# Patient Record
Sex: Female | Born: 1970 | Race: White | Hispanic: No | Marital: Single | Smoking: Current every day smoker
Health system: Southern US, Community
[De-identification: ages and names within clinical notes are randomized; demographics above are authoritative.]

## PROBLEM LIST (undated history)

## (undated) ENCOUNTER — Emergency Department: Admission: EM | Discharge status: Absent without leave | Payer: Medicaid Other

## (undated) DIAGNOSIS — F419 Anxiety disorder, unspecified: Secondary | ICD-10-CM

## (undated) DIAGNOSIS — I10 Essential (primary) hypertension: Secondary | ICD-10-CM

---

## 2004-06-11 ENCOUNTER — Emergency Department: Payer: Self-pay | Admitting: Emergency Medicine

## 2004-09-22 ENCOUNTER — Emergency Department: Payer: Self-pay | Admitting: Unknown Physician Specialty

## 2005-01-22 ENCOUNTER — Emergency Department: Payer: Self-pay | Admitting: Internal Medicine

## 2005-03-15 ENCOUNTER — Emergency Department: Payer: Self-pay | Admitting: Emergency Medicine

## 2005-03-16 ENCOUNTER — Emergency Department: Payer: Self-pay | Admitting: Emergency Medicine

## 2008-02-18 ENCOUNTER — Emergency Department: Payer: Self-pay | Admitting: Emergency Medicine

## 2008-12-16 ENCOUNTER — Emergency Department: Payer: Self-pay | Admitting: Emergency Medicine

## 2009-11-30 ENCOUNTER — Emergency Department: Payer: Self-pay | Admitting: Emergency Medicine

## 2010-12-06 ENCOUNTER — Ambulatory Visit: Payer: Self-pay | Admitting: Internal Medicine

## 2011-01-03 ENCOUNTER — Ambulatory Visit: Payer: Self-pay | Admitting: Internal Medicine

## 2011-08-18 ENCOUNTER — Emergency Department: Payer: Self-pay | Admitting: Emergency Medicine

## 2011-08-20 ENCOUNTER — Emergency Department: Payer: Self-pay | Admitting: *Deleted

## 2011-09-10 ENCOUNTER — Emergency Department: Payer: Self-pay | Admitting: Emergency Medicine

## 2012-12-30 ENCOUNTER — Emergency Department: Payer: Self-pay | Admitting: Emergency Medicine

## 2013-05-02 ENCOUNTER — Emergency Department: Payer: Self-pay | Admitting: Emergency Medicine

## 2013-10-03 ENCOUNTER — Ambulatory Visit: Payer: Self-pay | Admitting: Unknown Physician Specialty

## 2014-03-06 ENCOUNTER — Emergency Department: Payer: Self-pay | Admitting: Emergency Medicine

## 2014-03-06 LAB — CBC WITH DIFFERENTIAL/PLATELET
BASOS ABS: 0.1 10*3/uL (ref 0.0–0.1)
Basophil %: 0.4 %
Eosinophil #: 0 10*3/uL (ref 0.0–0.7)
Eosinophil %: 0.1 %
HCT: 42.9 % (ref 35.0–47.0)
HGB: 14.5 g/dL (ref 12.0–16.0)
LYMPHS PCT: 9.5 %
Lymphocyte #: 2.1 10*3/uL (ref 1.0–3.6)
MCH: 29.9 pg (ref 26.0–34.0)
MCHC: 33.7 g/dL (ref 32.0–36.0)
MCV: 89 fL (ref 80–100)
MONOS PCT: 7 %
Monocyte #: 1.6 x10 3/mm — ABNORMAL HIGH (ref 0.2–0.9)
Neutrophil #: 18.5 10*3/uL — ABNORMAL HIGH (ref 1.4–6.5)
Neutrophil %: 83 %
PLATELETS: 284 10*3/uL (ref 150–440)
RBC: 4.83 10*6/uL (ref 3.80–5.20)
RDW: 13.1 % (ref 11.5–14.5)
WBC: 22.4 10*3/uL — ABNORMAL HIGH (ref 3.6–11.0)

## 2014-03-06 LAB — GC/CHLAMYDIA PROBE AMP

## 2014-03-06 LAB — URINALYSIS, COMPLETE
Bacteria: NONE SEEN
Bilirubin,UR: NEGATIVE
Glucose,UR: NEGATIVE mg/dL (ref 0–75)
KETONE: NEGATIVE
NITRITE: NEGATIVE
Ph: 7 (ref 4.5–8.0)
Protein: NEGATIVE
RBC,UR: NONE SEEN /HPF (ref 0–5)
Specific Gravity: 1.005 (ref 1.003–1.030)
Squamous Epithelial: 6
WBC UR: 25 /HPF (ref 0–5)

## 2014-03-06 LAB — BASIC METABOLIC PANEL
ANION GAP: 7 (ref 7–16)
BUN: 7 mg/dL (ref 7–18)
Calcium, Total: 9.1 mg/dL (ref 8.5–10.1)
Chloride: 98 mmol/L (ref 98–107)
Co2: 28 mmol/L (ref 21–32)
Creatinine: 0.95 mg/dL (ref 0.60–1.30)
EGFR (African American): >60
GLUCOSE: 125 mg/dL — AB (ref 65–99)
Osmolality: 266 (ref 275–301)
Potassium: 4 mmol/L (ref 3.5–5.1)
SODIUM: 133 mmol/L — AB (ref 136–145)

## 2014-03-06 LAB — WET PREP, GENITAL

## 2014-03-09 LAB — CULTURE, BLOOD (SINGLE)

## 2014-03-12 LAB — CULTURE, BLOOD (SINGLE)

## 2014-07-28 ENCOUNTER — Emergency Department: Payer: Self-pay | Admitting: Emergency Medicine

## 2014-07-28 LAB — COMPREHENSIVE METABOLIC PANEL
ALBUMIN: 4.7 g/dL (ref 3.4–5.0)
ALT: 70 U/L — AB
ANION GAP: 11 (ref 7–16)
AST: 87 U/L — AB (ref 15–37)
Alkaline Phosphatase: 94 U/L
BUN: 15 mg/dL (ref 7–18)
Bilirubin,Total: 0.6 mg/dL (ref 0.2–1.0)
CHLORIDE: 105 mmol/L (ref 98–107)
CO2: 21 mmol/L (ref 21–32)
Calcium, Total: 9.2 mg/dL (ref 8.5–10.1)
Creatinine: 1.34 mg/dL — ABNORMAL HIGH (ref 0.60–1.30)
EGFR (African American): 56 — ABNORMAL LOW
GFR CALC NON AF AMER: 46 — AB
Glucose: 88 mg/dL (ref 65–99)
OSMOLALITY: 274 (ref 275–301)
Potassium: 3.3 mmol/L — ABNORMAL LOW (ref 3.5–5.1)
Sodium: 137 mmol/L (ref 136–145)
TOTAL PROTEIN: 8.4 g/dL — AB (ref 6.4–8.2)

## 2014-07-28 LAB — CBC
HCT: 39.8 % (ref 35.0–47.0)
HGB: 13.4 g/dL (ref 12.0–16.0)
MCH: 30.5 pg (ref 26.0–34.0)
MCHC: 33.6 g/dL (ref 32.0–36.0)
MCV: 91 fL (ref 80–100)
PLATELETS: 311 10*3/uL (ref 150–440)
RBC: 4.37 10*6/uL (ref 3.80–5.20)
RDW: 12.8 % (ref 11.5–14.5)
WBC: 12.4 10*3/uL — ABNORMAL HIGH (ref 3.6–11.0)

## 2014-07-28 LAB — ACETAMINOPHEN LEVEL: Acetaminophen: <2 ug/mL

## 2014-07-28 LAB — SALICYLATE LEVEL: Salicylates, Serum: 3 mg/dL — ABNORMAL HIGH

## 2014-07-28 LAB — ETHANOL: Ethanol: <3 mg/dL

## 2015-03-27 ENCOUNTER — Emergency Department
Admission: EM | Admit: 2015-03-27 | Discharge: 2015-03-27 | Disposition: A | Discharge status: Home / Self Care | Payer: Medicaid Other | Attending: Emergency Medicine | Admitting: Emergency Medicine

## 2015-03-27 ENCOUNTER — Encounter: Payer: Self-pay | Admitting: *Deleted

## 2015-03-27 ENCOUNTER — Emergency Department: Payer: Medicaid Other

## 2015-03-27 DIAGNOSIS — Y92410 Unspecified street and highway as the place of occurrence of the external cause: Secondary | ICD-10-CM | POA: Diagnosis not present

## 2015-03-27 DIAGNOSIS — Y998 Other external cause status: Secondary | ICD-10-CM | POA: Diagnosis not present

## 2015-03-27 DIAGNOSIS — Z720 Tobacco use: Secondary | ICD-10-CM | POA: Insufficient documentation

## 2015-03-27 DIAGNOSIS — S93601A Unspecified sprain of right foot, initial encounter: Secondary | ICD-10-CM | POA: Diagnosis not present

## 2015-03-27 DIAGNOSIS — Y9389 Activity, other specified: Secondary | ICD-10-CM | POA: Diagnosis not present

## 2015-03-27 DIAGNOSIS — S99921A Unspecified injury of right foot, initial encounter: Secondary | ICD-10-CM | POA: Diagnosis present

## 2015-03-27 HISTORY — DX: Anxiety disorder, unspecified: F41.9

## 2015-03-27 MED ORDER — IBUPROFEN 800 MG PO TABS
ORAL_TABLET | ORAL | Status: DC
Start: 2015-03-27 — End: 2015-03-27
  Filled 2015-03-27: qty 1

## 2015-03-27 MED ORDER — TRAMADOL HCL 50 MG PO TABS: 100.0000 mg | ORAL_TABLET | Freq: Once | ORAL | Status: DC

## 2015-03-27 MED ORDER — TRAMADOL HCL 50 MG PO TABS
ORAL_TABLET | ORAL | Status: AC
  Filled 2015-03-27: qty 2

## 2015-03-27 MED ORDER — IBUPROFEN 800 MG PO TABS: 800.0000 mg | ORAL_TABLET | Freq: Once | ORAL | Status: DC

## 2015-03-27 NOTE — ED Notes (Signed)
Pt angry and anxious, yelling, when asked about allergies pt stated she had no allergies, when offered tramadol for pain pt refused stating "I get hives", Pa notified, when offered ibprofuen pt refused stating she was at her limit for the day, Pa notified, pt states "I am not a pill seeker", calling this RN a b----, pt then proceeds to ask for food
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
## 2015-03-27 NOTE — Discharge Instructions (Signed)
Foot Sprain The muscles and cord like structures which attach muscle to bone (tendons) that surround the feet are made up of units. A foot sprain can occur at the weakest spot in any of these units. This condition is most often caused by injury to or overuse of the foot, as from playing contact sports, or aggravating a previous injury, or from poor conditioning, or obesity. SYMPTOMS  Pain with movement of the foot.  Tenderness and swelling at the injury site.  Loss of strength is present in moderate or severe sprains. THE THREE GRADES OR SEVERITY OF FOOT SPRAIN ARE:  Mild (Grade I): Slightly pulled muscle without tearing of muscle or tendon fibers or loss of strength.  Moderate (Grade II): Tearing of fibers in a muscle, tendon, or at the attachment to bone, with small decrease in strength.  Severe (Grade III): Rupture of the muscle-tendon-bone attachment, with separation of fibers. Severe sprain requires surgical repair. Often repeating (chronic) sprains are caused by overuse. Sudden (acute) sprains are caused by direct injury or over-use. DIAGNOSIS  Diagnosis of this condition is usually by your own observation. If problems continue, a caregiver may be required for further evaluation and treatment. X-rays may be required to make sure there are not breaks in the bones (fractures) present. Continued problems may require physical therapy for treatment. PREVENTION  Use strength and conditioning exercises appropriate for your sport.  Warm up properly prior to working out.  Use athletic shoes that are made for the sport you are participating in.  Allow adequate time for healing. Early return to activities makes repeat injury more likely, and can lead to an unstable arthritic foot that can result in prolonged disability. Mild sprains generally heal in 3 to 10 days, with moderate and severe sprains taking 2 to 10 weeks. Your caregiver can help you determine the proper time required for  healing. HOME CARE INSTRUCTIONS   Apply ice to the injury for 15-20 minutes, 03-04 times per day. Put the ice in a plastic bag and place a towel between the bag of ice and your skin.  An elastic wrap (like an Ace bandage) may be used to keep swelling down.  Keep foot above the level of the heart, or at least raised on a footstool, when swelling and pain are present.  Try to avoid use other than gentle range of motion while the foot is painful. Do not resume use until instructed by your caregiver. Then begin use gradually, not increasing use to the point of pain. If pain does develop, decrease use and continue the above measures, gradually increasing activities that do not cause discomfort, until you gradually achieve normal use.  Use crutches if and as instructed, and for the length of time instructed.  Keep injured foot and ankle wrapped between treatments.  Massage foot and ankle for comfort and to keep swelling down. Massage from the toes up towards the knee.  Only take over-the-counter or prescription medicines for pain, discomfort, or fever as directed by your caregiver. SEEK IMMEDIATE MEDICAL CARE IF:   Your pain and swelling increase, or pain is not controlled with medications.  You have loss of feeling in your foot or your foot turns cold or blue.  You develop new, unexplained symptoms, or an increase of the symptoms that brought you to your caregiver. MAKE SURE YOU:   Understand these instructions.  Will watch your condition.  Will get help right away if you are not doing well or get worse. Document Released:  01/10/2002 Document Revised: 10/13/2011 Document Reviewed: 03/09/2008 Exit[employer] Patient Information 2015 [place], [place]. This information is not intended to replace advice given to you by your health care provider. Make sure you discuss any questions you have with your health care provider.  Your exam and x-ray show a sprain to the foot. There is NO fracture. Wear the  ace bandage and cast shoe for comfort. Follow-up with Dr. Ellsworth Lennox for further care. Apply ice to reduce swelling. Rest with the foot elevated when seated. Continue with ibuprofen for pain relief.
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
## 2016-05-20 ENCOUNTER — Other Ambulatory Visit: Payer: Self-pay | Admitting: Internal Medicine

## 2016-05-20 DIAGNOSIS — Z1231 Encounter for screening mammogram for malignant neoplasm of breast: Secondary | ICD-10-CM

## 2016-06-20 ENCOUNTER — Ambulatory Visit: Payer: Medicaid Other | Attending: Internal Medicine
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
## 2016-10-08 ENCOUNTER — Emergency Department
Admission: EM | Admit: 2016-10-08 | Discharge: 2016-10-08 | Discharge status: Against Medical Advice | Payer: Medicaid Other | Attending: Emergency Medicine | Admitting: Emergency Medicine

## 2016-10-08 ENCOUNTER — Emergency Department: Payer: Medicaid Other

## 2016-10-08 ENCOUNTER — Encounter: Payer: Self-pay | Admitting: *Deleted

## 2016-10-08 DIAGNOSIS — F1410 Cocaine abuse, uncomplicated: Secondary | ICD-10-CM | POA: Diagnosis not present

## 2016-10-08 DIAGNOSIS — F17210 Nicotine dependence, cigarettes, uncomplicated: Secondary | ICD-10-CM | POA: Insufficient documentation

## 2016-10-08 DIAGNOSIS — I10 Essential (primary) hypertension: Secondary | ICD-10-CM | POA: Insufficient documentation

## 2016-10-08 DIAGNOSIS — T405X1A Poisoning by cocaine, accidental (unintentional), initial encounter: Secondary | ICD-10-CM | POA: Diagnosis present

## 2016-10-08 DIAGNOSIS — Z79899 Other long term (current) drug therapy: Secondary | ICD-10-CM | POA: Diagnosis not present

## 2016-10-08 HISTORY — DX: Essential (primary) hypertension: I10

## 2016-10-08 LAB — CBC WITH DIFFERENTIAL/PLATELET
BASOS ABS: 0.1 10*3/uL (ref 0–0.1)
Basophils Relative: 1 %
Eosinophils Absolute: 0.2 10*3/uL (ref 0–0.7)
Eosinophils Relative: 2 %
HEMATOCRIT: 39.3 % (ref 35.0–47.0)
Hemoglobin: 13.2 g/dL (ref 12.0–16.0)
LYMPHS ABS: 2.9 10*3/uL (ref 1.0–3.6)
LYMPHS PCT: 23 %
MCH: 29.9 pg (ref 26.0–34.0)
MCHC: 33.7 g/dL (ref 32.0–36.0)
MCV: 89 fL (ref 80.0–100.0)
Monocytes Absolute: 0.9 10*3/uL (ref 0.2–0.9)
Monocytes Relative: 7 %
NEUTROS ABS: 8.5 10*3/uL — AB (ref 1.4–6.5)
Neutrophils Relative %: 67 %
PLATELETS: 309 10*3/uL (ref 150–440)
RBC: 4.42 MIL/uL (ref 3.80–5.20)
RDW: 13.6 % (ref 11.5–14.5)
WBC: 12.5 10*3/uL — AB (ref 3.6–11.0)

## 2016-10-08 LAB — COMPREHENSIVE METABOLIC PANEL
ALK PHOS: 64 U/L (ref 38–126)
ALT: 32 U/L (ref 14–54)
AST: 42 U/L — AB (ref 15–41)
Albumin: 4.4 g/dL (ref 3.5–5.0)
Anion gap: 8 (ref 5–15)
BILIRUBIN TOTAL: 0.4 mg/dL (ref 0.3–1.2)
BUN: 12 mg/dL (ref 6–20)
CALCIUM: 9.3 mg/dL (ref 8.9–10.3)
CHLORIDE: 103 mmol/L (ref 101–111)
CO2: 25 mmol/L (ref 22–32)
CREATININE: 0.95 mg/dL (ref 0.44–1.00)
Glucose, Bld: 139 mg/dL — ABNORMAL HIGH (ref 65–99)
Potassium: 3.8 mmol/L (ref 3.5–5.1)
Sodium: 136 mmol/L (ref 135–145)
TOTAL PROTEIN: 7.5 g/dL (ref 6.5–8.1)

## 2016-10-08 LAB — ETHANOL

## 2016-10-08 LAB — TROPONIN I

## 2016-10-08 MED ORDER — DIAZEPAM 5 MG PO TABS
10.0000 mg | ORAL_TABLET | Freq: Once | ORAL | Status: AC
  Administered 2016-10-08: 10 mg via ORAL
  Filled 2016-10-08: qty 2

## 2016-10-08 NOTE — ED Provider Notes (Signed)
[facility] Emergency Department Provider Note        Time seen: ----------------------------------------- 3:12 PM on 10/08/2016 -----------------------------------------    I have reviewed the triage vital signs and the nursing notes.   HISTORY  Chief Complaint Drug Overdose    HPI Cindy Young is a 46 y.o. female presents to ER after injecting herself with what she thought was cocaine. Patient is unsure of cocaine was laced with other drugs or she took too much. Patient reports feeling very and easy, she had chest pain and she reports running around beating her chest to try to help herself feel better. She arrives very tearful and agitated but oriented. She denies any other drug use, denies recent illness or other complaints at this time.   Past Medical History:  Diagnosis Date  . Anxiety   . Hypertension     There are no active problems to display for this patient.   History reviewed. No pertinent surgical history.  Allergies Patient has no known allergies.  Social History Social History  Substance Use Topics  . Smoking status: Current Every Day Smoker    Packs/day: 0.50    Types: Cigarettes  . Smokeless tobacco: Never Used  . Alcohol use Yes     Comment: liquer "whatever I can get my hands on"    Review of Systems Constitutional: Negative for fever. Cardiovascular: Positive for chest pain Respiratory: Positive for difficulty breathing Gastrointestinal: Negative for abdominal pain, vomiting and diarrhea. Genitourinary: Negative for dysuria. Musculoskeletal: Negative for back pain. Skin: Negative for rash. Neurological: Negative for headaches, focal weakness or numbness. Psychiatric: Negative for suicidal or homicidal ideation, positive for IV drug abuse  10-point ROS otherwise negative.  ____________________________________________   PHYSICAL EXAM:  VITAL SIGNS: ED Triage Vitals  Enc Vitals Group     BP 10/08/16 1446 (!)  156/113     Pulse Rate 10/08/16 1446 93     Resp 10/08/16 1446 18     Temp 10/08/16 1446 98.2 F (36.8 C)     Temp src --      SpO2 10/08/16 1446 96 %     Weight 10/08/16 1443 200 lb (90.7 kg)     Height 10/08/16 1443 5\' 3"  (1.6 m)     Head Circumference --      Peak Flow --      Pain Score --      Pain Loc --      Pain Edu? --      Excl. in GC? --     Constitutional: Alert and oriented. Markedly agitated, tearful, moderate distress Eyes: Conjunctivae are normal. PERRL. Normal extraocular movements. ENT   Head: Normocephalic and atraumatic.   Nose: No congestion/rhinnorhea.   Mouth/Throat: Mucous membranes are moist.   Neck: No stridor. Cardiovascular: Rapid rate, regular rhythm. No murmurs, rubs, or gallops. Respiratory: Normal respiratory effort without tachypnea nor retractions. Breath sounds are clear and equal bilaterally. No wheezes/rales/rhonchi. Gastrointestinal: Soft and nontender. Normal bowel sounds Musculoskeletal: Nontender with normal range of motion in all extremities. No lower extremity tenderness nor edema. Neurologic:  Normal speech and language. No gross focal neurologic deficits are appreciated.  Skin:  Skin is warm, dry and intact. No rash noted. Psychiatric: Anxious mood and affect. Patient very tearful as well ____________________________________________  ED COURSE:  Pertinent labs & imaging results that were available during my care of the patient were reviewed by me and considered in my medical decision making (see chart for details). Patient presents  to ER after a drug ingestion. We will assess with labs and given oral Valium. Clinical Course as of Oct 08 1605  Wed Oct 08, 2016  1607 Patient acutely left AGAINST MEDICAL ADVICE without any explanation. Vital signs had been stable and she is alert and oriented  [JW]    Clinical Course User Index [JW] Emily Filbert, MD   Procedures ____________________________________________    LABS (pertinent positives/negatives)  Labs Reviewed  CBC WITH DIFFERENTIAL/PLATELET - Abnormal; Notable for the following:       Result Value   WBC 12.5 (*)    Neutro Abs 8.5 (*)    All other components within normal limits  COMPREHENSIVE METABOLIC PANEL - Abnormal; Notable for the following:    Glucose, Bld 139 (*)    AST 42 (*)    All other components within normal limits  TROPONIN I  URINE DRUG SCREEN, QUALITATIVE ([facility] ONLY)  ETHANOL    RADIOLOGY Images were viewed by me  Chest x-ray  IMPRESSION: Mild chronic bronchitic changes. No alveolar pneumonia nor CHF. ____________________________________________  FINAL ASSESSMENT AND PLAN  Cocaine abuse  Plan: Patient with labs and imaging as dictated above. Patient presented to the ER after using cocaine and possibly kill ingesting other substances. She has declined further workup and is leaving AGAINST MEDICAL ADVICE   Emily Filbert, MD   Note: This note was generated in part or whole with voice recognition software. Voice recognition is usually quite accurate but there are transcription errors that can and very often do occur. I apologize for any typographical errors that were not detected and corrected.     Emily Filbert, MD 10/08/16 [phone]

## 2016-10-08 NOTE — ED Notes (Signed)
Pt left ama 

## 2016-10-08 NOTE — ED Triage Notes (Signed)
Pt arrived to ED from home after injecting self with what pt thought was cocaine. Pt unsure if cocaine was laced with other drugs or if too much was injected but pt reports feeling uneasy. Pt is tearful in room and unable to sit still. Pt is alert and oriented x4 and in no resp distress at this time.

## 2018-01-16 IMAGING — CR DG CHEST 2V
2 series · 2 of 2 positions shown · non-contrast
Comparison: Chest x-ray of March 06, 2014

CLINICAL DATA: Chest pain and dyspnea. Possible cocaine overdose.
Current smoker. History of hypertension.

EXAM:
CHEST  2 VIEW

[chest pa]
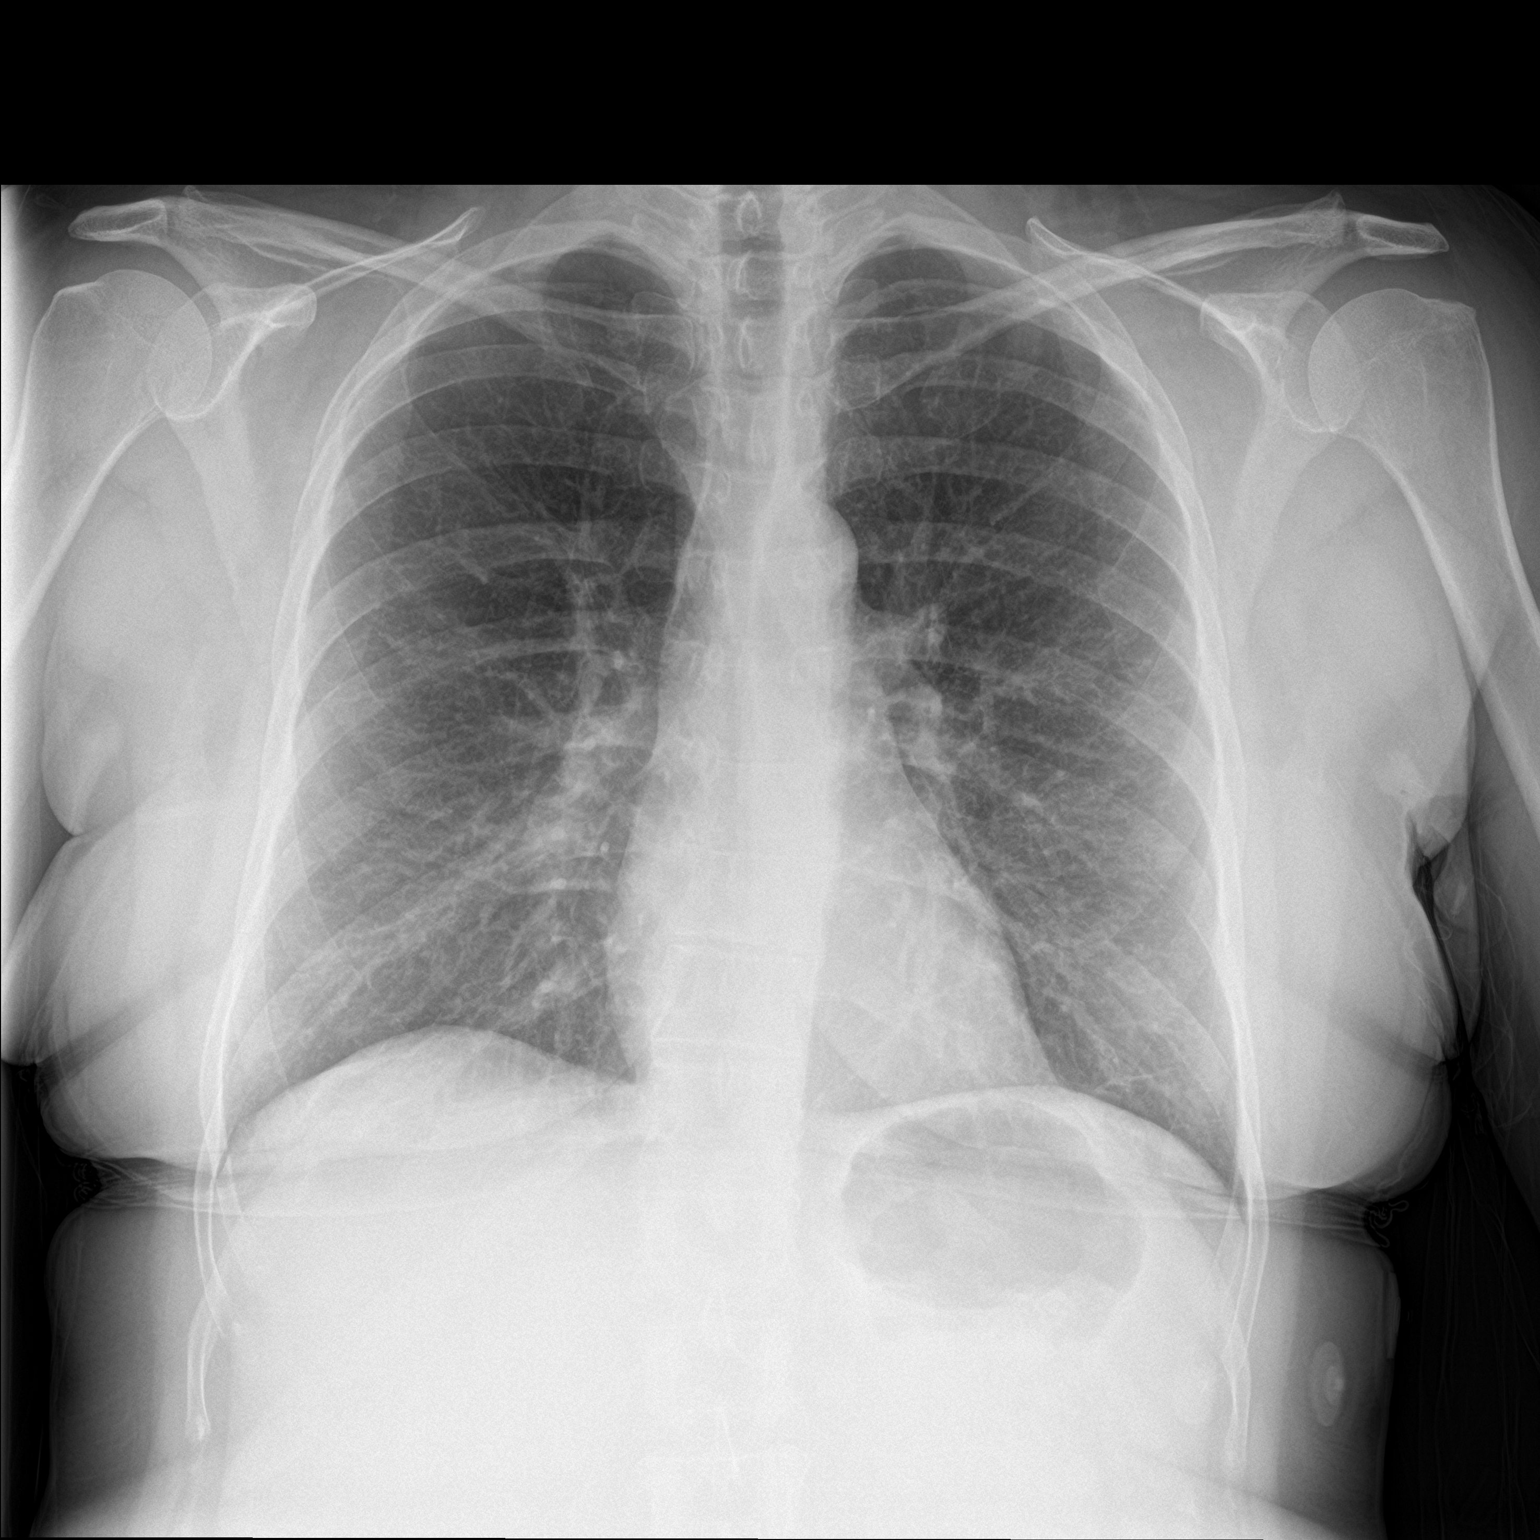

[chest lat]
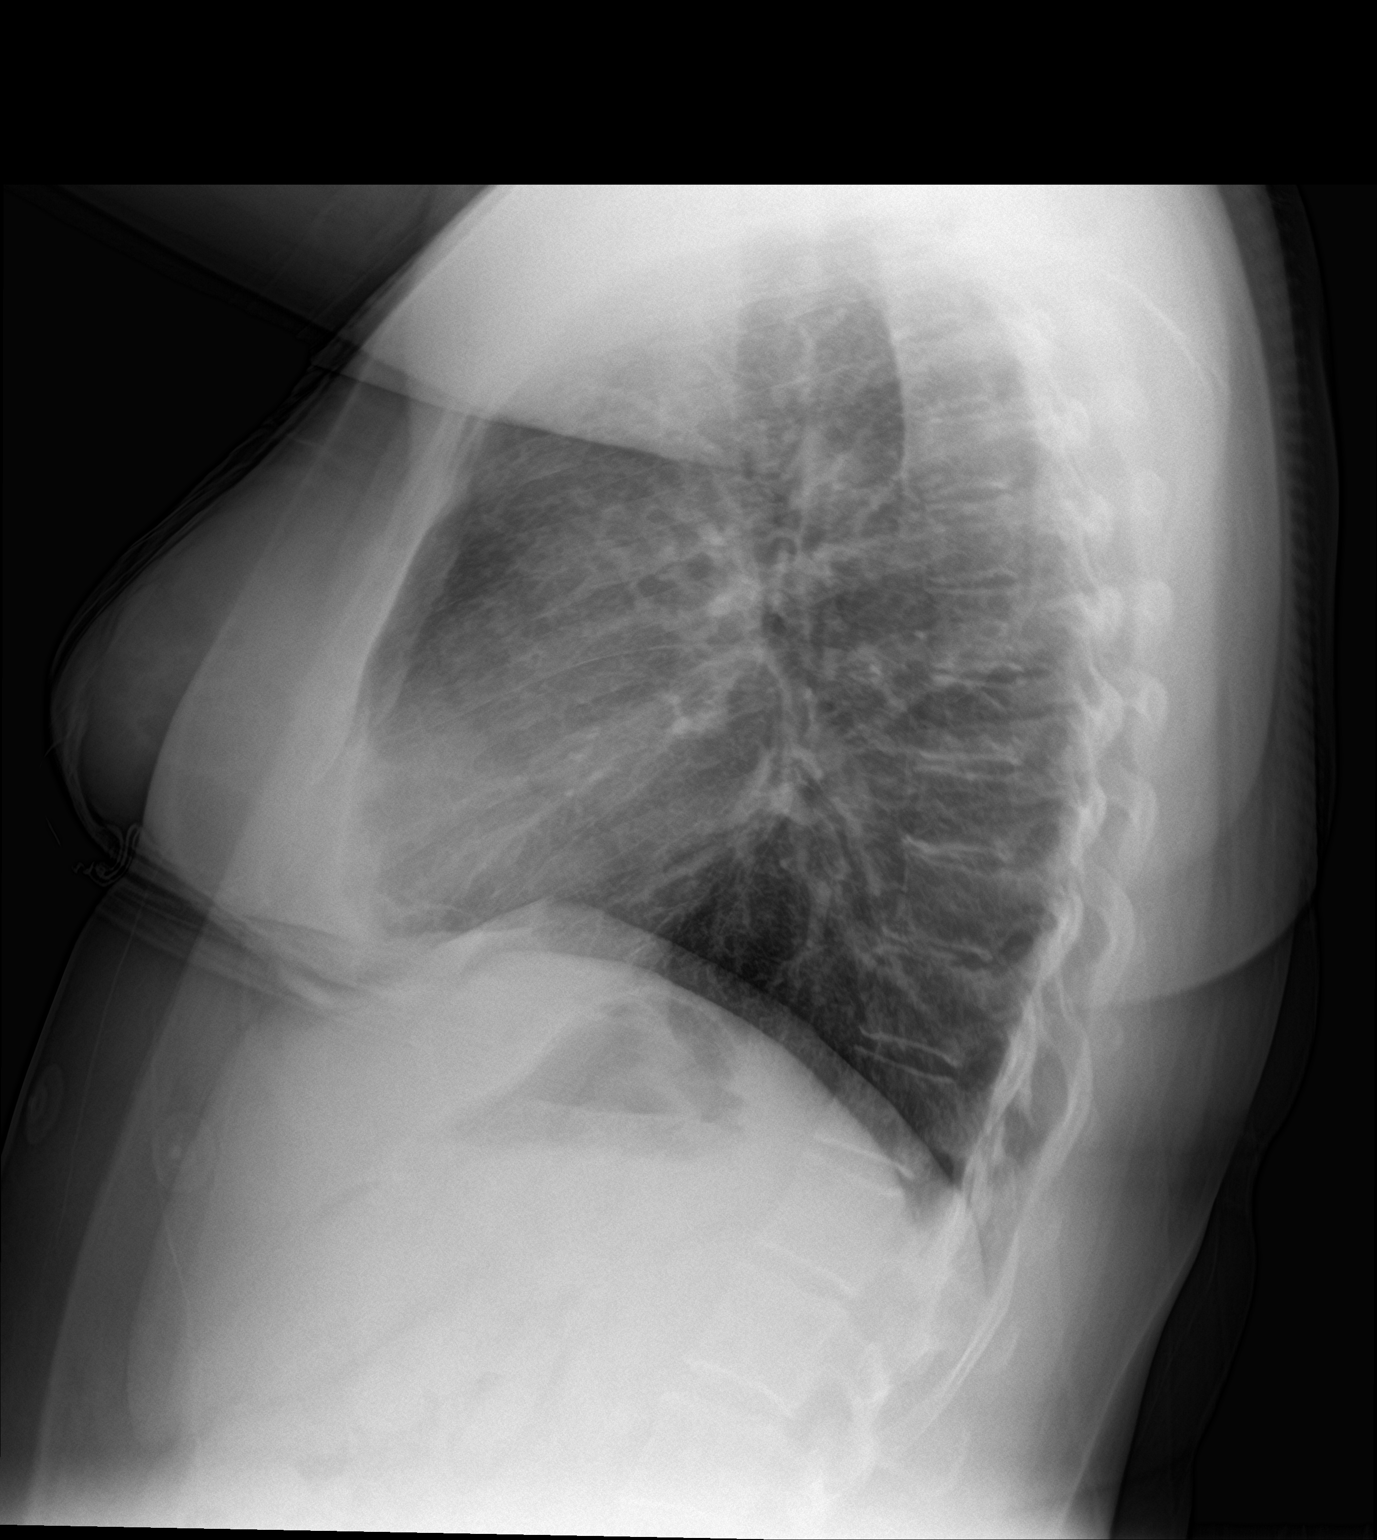

[2 of 2 positions shown; findings below may reference images not displayed]

FINDINGS: The lungs are adequately inflated. The interstitial markings are
coarse. There is no pneumothorax, alveolar pneumonia, or pleural
effusion. The heart and pulmonary vascularity are normal. The bony
thorax is unremarkable.
IMPRESSION: Mild chronic bronchitic changes.  No alveolar pneumonia nor CHF.

## 2018-04-28 DIAGNOSIS — F1190 Opioid use, unspecified, uncomplicated: Secondary | ICD-10-CM | POA: Insufficient documentation

## 2018-04-28 DIAGNOSIS — I10 Essential (primary) hypertension: Secondary | ICD-10-CM | POA: Insufficient documentation

## 2021-02-20 ENCOUNTER — Other Ambulatory Visit: Payer: Self-pay | Admitting: Internal Medicine

## 2021-02-20 DIAGNOSIS — R799 Abnormal finding of blood chemistry, unspecified: Secondary | ICD-10-CM

## 2021-12-13 DIAGNOSIS — E1165 Type 2 diabetes mellitus with hyperglycemia: Secondary | ICD-10-CM | POA: Insufficient documentation

## 2021-12-13 DIAGNOSIS — K829 Disease of gallbladder, unspecified: Secondary | ICD-10-CM | POA: Insufficient documentation

## 2021-12-13 DIAGNOSIS — F419 Anxiety disorder, unspecified: Secondary | ICD-10-CM | POA: Insufficient documentation

## 2021-12-13 DIAGNOSIS — F32A Depression, unspecified: Secondary | ICD-10-CM | POA: Insufficient documentation

## 2021-12-13 DIAGNOSIS — K760 Fatty (change of) liver, not elsewhere classified: Secondary | ICD-10-CM | POA: Insufficient documentation

## 2021-12-22 DIAGNOSIS — G629 Polyneuropathy, unspecified: Secondary | ICD-10-CM | POA: Insufficient documentation

## 2021-12-22 DIAGNOSIS — K589 Irritable bowel syndrome without diarrhea: Secondary | ICD-10-CM | POA: Insufficient documentation

## 2021-12-22 DIAGNOSIS — Z87891 Personal history of nicotine dependence: Secondary | ICD-10-CM | POA: Insufficient documentation

## 2021-12-22 DIAGNOSIS — E785 Hyperlipidemia, unspecified: Secondary | ICD-10-CM | POA: Insufficient documentation

## 2021-12-30 DIAGNOSIS — M79604 Pain in right leg: Secondary | ICD-10-CM | POA: Insufficient documentation

## 2022-01-15 LAB — COLOGUARD: COLOGUARD: NEGATIVE

## 2022-06-30 ENCOUNTER — Other Ambulatory Visit: Payer: Self-pay

## 2022-06-30 ENCOUNTER — Emergency Department
Admission: EM | Admit: 2022-06-30 | Discharge: 2022-06-30 | Discharge status: Against Medical Advice | Payer: BLUE CROSS/BLUE SHIELD | Attending: Emergency Medicine | Admitting: Emergency Medicine

## 2022-06-30 DIAGNOSIS — Z5321 Procedure and treatment not carried out due to patient leaving prior to being seen by health care provider: Secondary | ICD-10-CM | POA: Insufficient documentation

## 2022-06-30 DIAGNOSIS — R350 Frequency of micturition: Secondary | ICD-10-CM | POA: Insufficient documentation

## 2022-06-30 DIAGNOSIS — R5383 Other fatigue: Secondary | ICD-10-CM | POA: Diagnosis not present

## 2022-06-30 LAB — CBC
HCT: 41.9 % (ref 36.0–46.0)
Hemoglobin: 14.3 g/dL (ref 12.0–15.0)
MCH: 29.8 pg (ref 26.0–34.0)
MCHC: 34.1 g/dL (ref 30.0–36.0)
MCV: 87.3 fL (ref 80.0–100.0)
Platelets: 232 10*3/uL (ref 150–400)
RBC: 4.8 MIL/uL (ref 3.87–5.11)
RDW: 11.3 % — ABNORMAL LOW (ref 11.5–15.5)
WBC: 10.4 10*3/uL (ref 4.0–10.5)
nRBC: 0 % (ref 0.0–0.2)

## 2022-06-30 LAB — BASIC METABOLIC PANEL
Anion gap: 12 (ref 5–15)
BUN: 12 mg/dL (ref 6–20)
CO2: 27 mmol/L (ref 22–32)
Calcium: 9.9 mg/dL (ref 8.9–10.3)
Chloride: 94 mmol/L — ABNORMAL LOW (ref 98–111)
Creatinine, Ser: 0.63 mg/dL (ref 0.44–1.00)
GFR, Estimated: >60 mL/min (ref >60)
Glucose, Bld: 451 mg/dL — ABNORMAL HIGH (ref 70–99)
Potassium: 3.8 mmol/L (ref 3.5–5.1)
Sodium: 133 mmol/L — ABNORMAL LOW (ref 135–145)

## 2022-06-30 LAB — URINALYSIS, ROUTINE W REFLEX MICROSCOPIC
Bacteria, UA: NONE SEEN
Bilirubin Urine: NEGATIVE
Glucose, UA: >=500 mg/dL — AB
Hgb urine dipstick: NEGATIVE
Ketones, ur: NEGATIVE mg/dL
Leukocytes,Ua: NEGATIVE
Nitrite: NEGATIVE
Protein, ur: NEGATIVE mg/dL
Specific Gravity, Urine: 1.015 (ref 1.005–1.030)
pH: 7 (ref 5.0–8.0)

## 2022-06-30 LAB — CBG MONITORING, ED: Glucose-Capillary: 495 mg/dL — ABNORMAL HIGH (ref 70–99)

## 2022-06-30 LAB — POC URINE PREG, ED: Preg Test, Ur: NEGATIVE

## 2022-06-30 NOTE — ED Triage Notes (Signed)
Only takes PO meds for DM but glucose has been running in the 500s  Just complains of frequent urination and fatigue.

## 2022-06-30 NOTE — ED Notes (Signed)
Patient called x3 with no answer- 1809, 1826, 1839.

## 2022-08-09 DIAGNOSIS — Z8619 Personal history of other infectious and parasitic diseases: Secondary | ICD-10-CM

## 2022-08-09 HISTORY — DX: Personal history of other infectious and parasitic diseases: Z86.19

## 2022-10-09 DIAGNOSIS — I2510 Atherosclerotic heart disease of native coronary artery without angina pectoris: Secondary | ICD-10-CM | POA: Insufficient documentation

## 2022-11-03 DIAGNOSIS — Z8619 Personal history of other infectious and parasitic diseases: Secondary | ICD-10-CM | POA: Insufficient documentation

## 2022-11-11 ENCOUNTER — Encounter: Payer: Self-pay | Admitting: Family

## 2022-11-11 ENCOUNTER — Ambulatory Visit (INDEPENDENT_AMBULATORY_CARE_PROVIDER_SITE_OTHER): Payer: Medicaid Other | Admitting: Family

## 2022-11-11 VITALS — BP 120/80 | HR 79 | Ht 63.0 in | Wt 165.8 lb

## 2022-11-11 DIAGNOSIS — E1165 Type 2 diabetes mellitus with hyperglycemia: Secondary | ICD-10-CM

## 2022-11-11 DIAGNOSIS — E782 Mixed hyperlipidemia: Secondary | ICD-10-CM | POA: Diagnosis not present

## 2022-11-11 DIAGNOSIS — I10 Essential (primary) hypertension: Secondary | ICD-10-CM

## 2022-11-11 MED ORDER — OZEMPIC (1 MG/DOSE) 4 MG/3ML ~~LOC~~ SOPN: 1.0000 mg | PEN_INJECTOR | SUBCUTANEOUS | 3 refills | Status: AC

## 2022-11-12 ENCOUNTER — Encounter: Payer: Self-pay | Admitting: Family

## 2022-11-12 NOTE — Progress Notes (Signed)
Established Patient Office Visit  Subjective:  Patient ID: Cindy Young, female    DOB: 1971/03/20  Age: 52 y.o. MRN: [id number]  Chief Complaint  Patient presents with   Follow-up    Med check    Patient is here for refills.  She was previously seeing Dr. Ellsworth Lennox, but her insurance went out of network for [place] and she had to change providers. However, she is on a different insurance and she is unhappy with the insurance she had, so she has come back to see [place] here.   Needs a refill on her Ozempic, has lost >40lbs while using.    No other concerns at this time.   Past Medical History:  Diagnosis Date   Anxiety    History of hepatitis B 08/09/2022   Hypertension     History reviewed. No pertinent surgical history.  Social History   Socioeconomic History   Marital status: Single    Spouse name: Not on file   Number of children: Not on file   Years of education: Not on file   Highest education level: Not on file  Occupational History   Not on file  Tobacco Use   Smoking status: Every Day    Packs/day: .5    Types: Cigarettes   Smokeless tobacco: Never  Substance and Sexual Activity   Alcohol use: Yes    Comment: liquer "whatever I can get my hands on"   Drug use: Yes    Types: Cocaine    Comment: pt began using again 10/2016   Sexual activity: Not on file  Other Topics Concern   Not on file  Social History Narrative   Not on file   Social Determinants of Health   Financial Resource Strain: Not on file  Food Insecurity: Not on file  Transportation Needs: Not on file  Physical Activity: Not on file  Stress: Not on file  Social Connections: Not on file  Intimate Partner Violence: Not on file    History reviewed. No pertinent family history.  Allergies  Allergen Reactions   Amoxicillin-Pot Clavulanate Other (See Comments)   Atorvastatin     Other Reaction(s): Dizziness  Felt 'strange' after just a few weeks of starting so discontinued.   Doxycycline      Other Reaction(s): Other (See Comments)   Morphine And Related     Other Reaction(s): Other (See Comments)  H/o opioid addiction - wants to stay away from   Paroxetine Hcl Other (See Comments)    Other reaction(s): Other (See Comments)   Ezetimibe Anxiety    "Felt crazy"    Review of Systems  All other systems reviewed and are negative.      Objective:   BP 120/80   Pulse 79   Ht 5\' 3"  (1.6 m)   Wt 165 lb 12.8 oz (75.2 kg)   SpO2 96%   BMI 29.37 kg/m   Vitals:   11/11/22 1458  BP: 120/80  Pulse: 79  Height: 5\' 3"  (1.6 m)  Weight: 165 lb 12.8 oz (75.2 kg)  SpO2: 96%  BMI (Calculated): 29.38    Physical Exam Vitals and nursing note reviewed.  Constitutional:      Appearance: Normal appearance. She is normal weight.  HENT:     Head: Normocephalic.  Eyes:     Pupils: Pupils are equal, round, and reactive to light.  Cardiovascular:     Rate and Rhythm: Normal rate.  Pulmonary:     Effort: Pulmonary effort is  normal.  Neurological:     Mental Status: She is alert.      No results found for any visits on 11/11/22.      Assessment & Plan:   Problem List Items Addressed This Visit     Hyperlipidemia   Relevant Medications   chlorthalidone (HYGROTON) 25 MG tablet   Hypertension   Relevant Medications   chlorthalidone (HYGROTON) 25 MG tablet   Type 2 diabetes mellitus with hyperglycemia, without long-term current use of insulin - Primary   Relevant Medications   metFORMIN (GLUCOPHAGE) 1000 MG tablet   OZEMPIC, 1 MG/DOSE, 4 MG/3ML SOPN   Sending refills for her medications.  Continue current dosing.   Return in about 3 months (around 02/10/2023) for F/U.   Total time spent: 20 minutes  Miki Kins, FNP  11/11/2022
